# Patient Record
Sex: Female | Born: 1995 | Race: Black or African American | Marital: Single | Smoking: Never smoker
Health system: Southern US, Community
[De-identification: ages and names within clinical notes are randomized; demographics above are authoritative.]

---

## 2020-03-12 ENCOUNTER — Ambulatory Visit (INDEPENDENT_AMBULATORY_CARE_PROVIDER_SITE_OTHER): Payer: BC Managed Care – PPO | Admitting: Surgery

## 2020-03-12 ENCOUNTER — Encounter: Payer: Self-pay | Admitting: Surgery

## 2020-03-12 ENCOUNTER — Ambulatory Visit: Payer: Self-pay

## 2020-03-12 VITALS — BP 111/71 | HR 83 | Ht 67.0 in | Wt 125.0 lb

## 2020-03-12 DIAGNOSIS — M542 Cervicalgia: Secondary | ICD-10-CM

## 2020-03-12 DIAGNOSIS — M25512 Pain in left shoulder: Secondary | ICD-10-CM

## 2020-03-12 DIAGNOSIS — M7542 Impingement syndrome of left shoulder: Secondary | ICD-10-CM | POA: Diagnosis not present

## 2020-03-12 MED ORDER — METHYLPREDNISOLONE ACETATE 40 MG/ML IJ SUSP
40.0000 mg | INTRAMUSCULAR | Status: AC | PRN
  Administered 2020-03-12: 40 mg via INTRA_ARTICULAR

## 2020-03-12 MED ORDER — BUPIVACAINE HCL 0.25 % IJ SOLN
4.0000 mL | INTRAMUSCULAR | Status: AC | PRN
  Administered 2020-03-12: 4 mL via INTRA_ARTICULAR

## 2020-03-12 MED ORDER — LIDOCAINE HCL 1 % IJ SOLN
3.0000 mL | INTRAMUSCULAR | Status: AC | PRN
  Administered 2020-03-12: 3 mL
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
## 2020-04-02 ENCOUNTER — Ambulatory Visit (INDEPENDENT_AMBULATORY_CARE_PROVIDER_SITE_OTHER): Payer: BC Managed Care – PPO | Admitting: Surgery

## 2020-04-02 ENCOUNTER — Other Ambulatory Visit: Payer: Self-pay

## 2020-04-02 ENCOUNTER — Ambulatory Visit
Admission: RE | Admit: 2020-04-02 | Discharge: 2020-04-02 | Disposition: A | Discharge status: Home / Self Care | Payer: BC Managed Care – PPO | Source: Ambulatory Visit | Attending: Surgery | Admitting: Surgery

## 2020-04-02 DIAGNOSIS — M542 Cervicalgia: Secondary | ICD-10-CM | POA: Diagnosis not present

## 2020-04-02 DIAGNOSIS — M25512 Pain in left shoulder: Secondary | ICD-10-CM

## 2020-04-02 DIAGNOSIS — R590 Localized enlarged lymph nodes: Secondary | ICD-10-CM | POA: Diagnosis not present
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
## 2020-04-15 ENCOUNTER — Encounter: Payer: Self-pay | Admitting: Surgery

## 2020-04-17 ENCOUNTER — Encounter: Payer: Self-pay | Admitting: Surgery

## 2020-04-17 ENCOUNTER — Ambulatory Visit (INDEPENDENT_AMBULATORY_CARE_PROVIDER_SITE_OTHER): Payer: BC Managed Care – PPO | Admitting: Surgery

## 2020-04-17 DIAGNOSIS — M542 Cervicalgia: Secondary | ICD-10-CM

## 2020-04-17 NOTE — Progress Notes (Signed)
24 year old female returns for recheck of her neck pain.  There is some confusion with today's appointment.  She thought that she was seen Dr. Alvester Morin today for consult regarding her neck.  Advised patient that that is scheduled for next week.  States that neck symptoms unchanged.  I am no charge patient for today's visit.  She will also keep follow-up appointment with Dr. Ophelia Charter in a few weeks.

## 2020-04-18 ENCOUNTER — Encounter (INDEPENDENT_AMBULATORY_CARE_PROVIDER_SITE_OTHER): Payer: Self-pay | Admitting: Otolaryngology

## 2020-04-18 ENCOUNTER — Other Ambulatory Visit: Payer: Self-pay

## 2020-04-18 ENCOUNTER — Ambulatory Visit (INDEPENDENT_AMBULATORY_CARE_PROVIDER_SITE_OTHER): Payer: BC Managed Care – PPO | Admitting: Otolaryngology

## 2020-04-18 VITALS — Temp 97.9°F

## 2020-04-18 DIAGNOSIS — R599 Enlarged lymph nodes, unspecified: Secondary | ICD-10-CM | POA: Diagnosis not present

## 2020-04-18 DIAGNOSIS — J351 Hypertrophy of tonsils: Secondary | ICD-10-CM

## 2020-04-18 NOTE — Progress Notes (Signed)
HPI: Tammy Craig is a 24 y.o. female who presents is referred by Zonia Kief, PA-C for evaluation of swollen lymph node noted on recent MRI scan.  Patient also had some change in her voice and complained of a dry mouth.  She was instructed to take some skittles and this seemed to help relieve the dry mouth symptoms.  Also noted on the MRI scan were enlarged tonsils as well as a slightly enlarged left level 2 lymph node.  She denies any sore throat and has no trouble swallowing.  She was having more trouble when she had a dry mouth but this is doing better..  No past medical history on file. No past surgical history on file. Social History   Socioeconomic History  . Marital status: Single    Spouse name: Not on file  . Number of children: Not on file  . Years of education: Not on file  . Highest education level: Not on file  Occupational History  . Not on file  Tobacco Use  . Smoking status: Never Smoker  . Smokeless tobacco: Never Used  Substance and Sexual Activity  . Alcohol use: Not on file  . Drug use: Not on file  . Sexual activity: Not on file  Other Topics Concern  . Not on file  Social History Narrative  . Not on file   Social Determinants of Health   Financial Resource Strain:   . Difficulty of Paying Living Expenses: Not on file  Food Insecurity:   . Worried About Programme researcher, broadcasting/film/video in the Last Year: Not on file  . Ran Out of Food in the Last Year: Not on file  Transportation Needs:   . Lack of Transportation (Medical): Not on file  . Lack of Transportation (Non-Medical): Not on file  Physical Activity:   . Days of Exercise per Week: Not on file  . Minutes of Exercise per Session: Not on file  Stress:   . Feeling of Stress : Not on file  Social Connections:   . Frequency of Communication with Friends and Family: Not on file  . Frequency of Social Gatherings with Friends and Family: Not on file  . Attends Religious Services: Not on file  . Active Member of  Clubs or Organizations: Not on file  . Attends Banker Meetings: Not on file  . Marital Status: Not on file   No family history on file. No Known Allergies Prior to Admission medications   Medication Sig Start Date End Date Taking? Authorizing Provider  amoxicillin-clavulanate (AUGMENTIN) 875-125 MG tablet Take 1 tablet by mouth 2 (two) times daily. 04/15/20   [provider]     Positive ROS: Otherwise negative  All other systems have been reviewed and were otherwise negative with the exception of those mentioned in the HPI and as above.  Physical Exam: Constitutional: Alert, well-appearing, no acute distress.  She has no significant hoarseness. Ears: External ears without lesions or tenderness. Ear canals are clear bilaterally with intact, clear TMs.  Nasal: External nose without lesions. Septum midline.  Middle meatus regions were clear with no signs of infection..  Oral: Lips and gums without lesions. Tongue and palate mucosa without lesions. Posterior oropharynx clear.  Tonsils are moderately large and symmetric in appearance 2-3+ the left tonsil slightly larger than the right.  Drainage from the submandibular and parotid ducts was clear.  Mucous membranes were moist and normal in appearance. Indirect laryngoscopy revealed a clear base of tongue vallecula and  epiglottis.  Vocal cords were clear bilaterally although she did have some very small vocal cord nodules anteriorly with normal vocal mobility. Neck: Patient has a minimally enlarged left upper jugular node consistent with a level 2 node identified an MRI scan but this is mobile and measures less than 2 cm.  She has no significant adenopathy lower in the neck no supraclavicular adenopathy and no posterior neck adenopathy. Respiratory: Breathing comfortably  Skin: No facial/neck lesions or rash noted.  Procedures  Assessment: Tonsillar hypertrophy with reactive left neck lymphadenopathy. She has small vocal  cord nodules.  Plan: Reassured her of normal upper airway examination.  She does have a generous sized tonsils but has no history of tonsil infections.  The left neck adenopathy noted on the MRI scan is consistent with reactive lymphadenopathy.   Narda Bonds, MD   CC:

## 2020-04-29 ENCOUNTER — Encounter: Payer: Self-pay | Admitting: Physical Medicine and Rehabilitation

## 2020-04-29 ENCOUNTER — Other Ambulatory Visit: Payer: Self-pay

## 2020-04-29 ENCOUNTER — Ambulatory Visit (INDEPENDENT_AMBULATORY_CARE_PROVIDER_SITE_OTHER): Payer: BC Managed Care – PPO | Admitting: Physical Medicine and Rehabilitation

## 2020-04-29 VITALS — BP 135/83 | HR 79

## 2020-04-29 DIAGNOSIS — M898X1 Other specified disorders of bone, shoulder: Secondary | ICD-10-CM | POA: Diagnosis not present

## 2020-04-29 DIAGNOSIS — M25511 Pain in right shoulder: Secondary | ICD-10-CM

## 2020-04-29 DIAGNOSIS — G8929 Other chronic pain: Secondary | ICD-10-CM

## 2020-04-29 DIAGNOSIS — M542 Cervicalgia: Secondary | ICD-10-CM

## 2020-04-29 DIAGNOSIS — M7918 Myalgia, other site: Secondary | ICD-10-CM | POA: Diagnosis not present

## 2020-04-29 NOTE — Progress Notes (Signed)
Neck pain, pain in right shoulder blade, Mid back and low back pain. Hurts to lie down, sit, or stand for long periods of time. States that nothing external hurts, pain is all internal.  Numeric Pain Rating Scale and Functional Assessment Average Pain 9 Pain Right Now 7 My pain is constant, sharp, dull and aching Pain is worse with: walking, sitting and standing Pain improves with: heat/ice   In the last MONTH (on 0-10 scale) has pain interfered with the following?  1. General activity like being  able to carry out your everyday physical activities such as walking, climbing stairs, carrying groceries, or moving a chair?  Rating(4)  2. Relation with others like being able to carry out your usual social activities and roles such as  activities at home, at work and in your community. Rating(4)  3. Enjoyment of life such that you have  been bothered by emotional problems such as feeling anxious, depressed or irritable?  Rating(7)

## 2020-05-07 ENCOUNTER — Encounter: Payer: Self-pay | Admitting: Orthopaedic Surgery

## 2020-05-07 ENCOUNTER — Ambulatory Visit (INDEPENDENT_AMBULATORY_CARE_PROVIDER_SITE_OTHER): Payer: BC Managed Care – PPO | Admitting: Orthopaedic Surgery

## 2020-05-07 DIAGNOSIS — M542 Cervicalgia: Secondary | ICD-10-CM | POA: Diagnosis not present

## 2020-05-12 DIAGNOSIS — M542 Cervicalgia: Secondary | ICD-10-CM | POA: Insufficient documentation
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
## 2020-05-14 ENCOUNTER — Ambulatory Visit (INDEPENDENT_AMBULATORY_CARE_PROVIDER_SITE_OTHER): Payer: BC Managed Care – PPO | Admitting: Rehabilitative and Restorative Service Providers"

## 2020-05-14 ENCOUNTER — Other Ambulatory Visit: Payer: Self-pay

## 2020-05-14 ENCOUNTER — Encounter: Payer: Self-pay | Admitting: Rehabilitative and Restorative Service Providers"

## 2020-05-14 DIAGNOSIS — R293 Abnormal posture: Secondary | ICD-10-CM | POA: Diagnosis not present

## 2020-05-14 DIAGNOSIS — M546 Pain in thoracic spine: Secondary | ICD-10-CM | POA: Diagnosis not present

## 2020-05-14 DIAGNOSIS — M25511 Pain in right shoulder: Secondary | ICD-10-CM

## 2020-05-14 DIAGNOSIS — G8929 Other chronic pain: Secondary | ICD-10-CM

## 2020-05-14 DIAGNOSIS — M542 Cervicalgia: Secondary | ICD-10-CM

## 2020-05-14 NOTE — Therapy (Addendum)
[facility] [address] [place], [place], [address] Phone: [phone]   Fax:  [phone]  Physical Therapy Evaluation/Discharge  Patient Details  Name: Tammy Craig MRN: [id number] Date of Birth: [birth year] Referring Provider (PT): Dr. Ernestina Patches   Encounter Date: 05/14/2020   PT End of Session - 05/14/20 1039    Visit Number 1    Number of Visits 12    Date for PT Re-Evaluation 07/09/20    Progress Note Due on Visit 10    PT Start Time 1030    PT Stop Time 1058    PT Time Calculation (min) 28 min    Activity Tolerance Patient tolerated treatment well    Behavior During Therapy [facility] for tasks assessed/performed           History reviewed. No pertinent past medical history.  History reviewed. No pertinent surgical history.  There were no vitals filed for this visit.    Subjective Assessment - 05/14/20 1034    Subjective Pt. indicated complaints of mid back and shoulder blade pains on Rt, noted c prolonged standing and some bending.  Rated symptoms increased in last 6 months or so.    Patient Stated Goals Reduce pain.    Currently in Pain? Yes    Pain Score 0-No pain   pain at worst 8/10   Pain Location --   thoracic pain, Rt shoulder posterior   Pain Orientation Posterior;Right;Mid    Pain Descriptors / Indicators Aching;Tightness    Pain Type Chronic pain    Pain Onset More than a month ago    Pain Frequency Intermittent    Aggravating Factors  bending, prolonged standing/walking, prolonged sitting    Effect of Pain on Daily Activities Work requires sitting, driving to and from work.              [facility] PT Assessment - 05/14/20 0001      Assessment   Medical Diagnosis Cervicalgia, Rt shoulder pain, myofascial pain    Referring Provider (PT) Dr. Ernestina Patches    Onset Date/Surgical Date 11/06/19    Hand Dominance Right      Precautions   Precautions None      Restrictions   Weight Bearing Restrictions No      Balance  Screen   Has the patient fallen in the past 6 months No    Is the patient reluctant to leave their home because of a fear of falling?  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 asked about Rt lower rib movement.  Assessment revealed no pain, mild crepitus in cartilage junction to lower Rt ribs 10-12.  Normal mobility assessed    Focus on Therapeutic Outcomes (FOTO)  To be assessed 2nd visit due to arrival late      Posture/Postural Control   Posture Comments Mild forward head posture, increased kyphosis: easily corrected c verbal cues      ROM / Strength   AROM / PROM / Strength Strength;PROM;AROM      AROM   Overall AROM Comments Mid thoracic pain noted c thoracic extension (75% WFL), Lt and Rt rotations (100% WFL)   No specific complaints c gross GH jt mobility bilateral   AROM Assessment Site Cervical;Shoulder  Cervical Flexion 55    Cervical Extension 65    Cervical - Right Rotation 85    Cervical - Left Rotation 85      Strength   Strength Assessment Site Shoulder;Elbow    Right/Left Shoulder Left;Right    Right Shoulder Flexion 5/5    Right Shoulder ABduction 5/5    Right Shoulder Internal Rotation 5/5    Right Shoulder External Rotation 5/5    Left Shoulder Flexion 5/5    Left Shoulder ABduction 5/5    Left Shoulder Internal Rotation 5/5    Left Shoulder External Rotation 5/5    Right/Left Elbow Left;Right    Right Elbow Flexion 5/5    Right Elbow Extension 5/5    Left Elbow Flexion 5/5    Left Elbow Extension 5/5      Palpation   Spinal mobility Concordant pain, guarding noted througout mid thoracic cPA, Rt uPA T4-T9    Palpation comment Tenderness and TrP noted in Rt infraspinatus, Rt thoracic paraspinals                       Objective measurements completed on examination: See above findings.       [place] [facility] PT Treatment/Exercise - 05/14/20 0001      Exercises   Exercises Other Exercises    Other Exercises  HEP instruction/performance c cues, handout, trial set of exercises consisting of thoracic extension seated, qruped rotation bilateral, seated bilateral UE er c scaption                    PT Short Term Goals - 05/14/20 1039      PT SHORT TERM GOAL #1   Title Patient will demonstrate independent use of home exercise program to maintain progress from in clinic treatments.    Time 3    Period Weeks    Status New    Target Date 06/04/20             PT Long Term Goals - 05/14/20 1147      PT LONG TERM GOAL #1   Title Patient will demonstrate/report pain at worst less than or equal to 2/10 to facilitate minimal limitation in daily activity secondary to pain symptoms.    Time 8    Period Weeks    Status New    Target Date 07/09/20      PT LONG TERM GOAL #2   Title Patient will demonstrate independent use of home exercise program to facilitate ability to maintain/progress functional gains from skilled physical therapy services.    Time 8    Period Weeks    Status New    Target Date 07/09/20      PT LONG TERM GOAL #3   Title Pt. will demonstrate thoracic AROM WFL s symptoms to faclitate ability to perform standing, walking, sitting movement at PLOF s limitation.    Time 8    Period Weeks    Status New    Target Date 07/09/20      PT LONG TERM GOAL #4   Title Pt. will demonstrate/report ability to perform work, driving at [employer] s limitation.    Time 8    Period Weeks    Status New    Target Date 07/09/20                  Plan - 05/14/20 1144    Clinical Impression Statement Patient is a 24 y.o. female who comes to  clinic with complaints of thoracic, Rt shoulder posterior pain with mobility deficits and myofascial complaints that impair  their ability to perform usual daily and recreational functional activities without increase difficulty/symptoms at this time.  Patient to benefit from skilled PT services to address impairments and limitations to improve to previous level of function without restriction secondary to condition.    Examination-Activity Limitations Sit;Stand;Other   walking   Examination-Participation Restrictions Occupation;Community Activity;Shop;Driving    Stability/Clinical Decision Making Stable/Uncomplicated    Clinical Decision Making Low    Rehab Potential Good    PT Frequency --   1-2x/week   PT Duration 8 weeks    PT Treatment/Interventions ADLs/Self Care Home Management;Cryotherapy;Electrical Stimulation;Iontophoresis 48m/ml Dexamethasone;Moist Heat;Traction;Therapeutic exercise;Balance training;Therapeutic activities;Functional mobility training;Stair training;Gait training;Ultrasound;Neuromuscular re-education;Patient/family education;Spinal Manipulations;Joint Manipulations;Passive range of motion;Dry needling;Manual techniques;Taping    PT Next Visit Plan Reassess HEP, possible DN, thoracic mobility gains, posterior scapular strengthening.    COLLECT FOTO on visit 2 (thoracic pain)    PT Home Exercise Plan [id number]    Consulted and Agree with Plan of Care Patient           Patient will benefit from skilled therapeutic intervention in order to improve the following deficits and impairments:  Decreased endurance, Hypomobility, Pain, Increased fascial restricitons, Decreased activity tolerance, Decreased mobility, Difficulty walking, Increased muscle spasms, Improper body mechanics, Impaired perceived functional ability, Decreased range of motion, Postural dysfunction, Impaired flexibility  Visit Diagnosis: Pain in thoracic spine  Abnormal posture  Chronic right shoulder pain  Cervicalgia     Problem List Patient Active Problem List   Diagnosis Date Noted  . Cervical muscle pain 05/12/2020     MScot Jun PT, DPT, OCS, ATC 05/14/20  12:13 PM  PHYSICAL THERAPY DISCHARGE SUMMARY  Visits from Start of Care: 1  Current functional level related to goals / functional outcomes: See note   Remaining deficits: See note   Education / Equipment: HEP Plan: Patient agrees to discharge.  Patient goals were not met. Patient is being discharged due to not returning since the last visit.  ?????    MScot Jun PT, DPT, OCS, ATC 07/24/20  2:50 PM      [facility] [address] [address]Phone: [phone]  Fax:  [phone] Name: Tammy RegasMRN: [id number] of Birth: 2Mar 13, 1997

## 2020-05-21 ENCOUNTER — Encounter: Payer: BC Managed Care – PPO | Admitting: Rehabilitative and Restorative Service Providers"

## 2020-05-21 ENCOUNTER — Telehealth: Payer: Self-pay | Admitting: Rehabilitative and Restorative Service Providers"

## 2020-05-21 NOTE — Telephone Encounter (Signed)
Called Pt. About appointment today, unable to leave message due to bo not being set up.  Chyrel Masson, PT, DPT, OCS, ATC 05/21/20  4:16 PM

## 2020-07-17 ENCOUNTER — Telehealth: Payer: Self-pay | Admitting: Orthopaedic Surgery

## 2020-07-17 NOTE — Telephone Encounter (Signed)
Patient moved to [facility] and needs records sent to another ortho. I emailed her Berkley Harvey to complete

## 2020-11-24 ENCOUNTER — Encounter: Payer: Self-pay | Admitting: Physical Medicine and Rehabilitation
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
